# Patient Record
Sex: Male | Born: 1980 | Race: White | Hispanic: No | Marital: Single | Smoking: Current every day smoker
Health system: Southern US, Community
[De-identification: ages and names within clinical notes are randomized; demographics above are authoritative.]

## PROBLEM LIST (undated history)

## (undated) DIAGNOSIS — F32A Depression, unspecified: Secondary | ICD-10-CM

## (undated) DIAGNOSIS — T7840XA Allergy, unspecified, initial encounter: Secondary | ICD-10-CM

## (undated) DIAGNOSIS — F419 Anxiety disorder, unspecified: Secondary | ICD-10-CM

## (undated) DIAGNOSIS — G8929 Other chronic pain: Secondary | ICD-10-CM

## (undated) DIAGNOSIS — F329 Major depressive disorder, single episode, unspecified: Secondary | ICD-10-CM

## (undated) DIAGNOSIS — M549 Dorsalgia, unspecified: Secondary | ICD-10-CM

## (undated) HISTORY — DX: Allergy, unspecified, initial encounter: T78.40XA

## (undated) HISTORY — PX: NO PAST SURGERIES: SHX2092

---

## 2001-06-05 ENCOUNTER — Emergency Department (HOSPITAL_COMMUNITY): Admission: EM | Admit: 2001-06-05 | Discharge: 2001-06-05 | Payer: Self-pay | Admitting: Emergency Medicine

## 2001-06-05 ENCOUNTER — Encounter: Payer: Self-pay | Admitting: Emergency Medicine

## 2006-01-25 ENCOUNTER — Emergency Department (HOSPITAL_COMMUNITY): Admission: EM | Admit: 2006-01-25 | Discharge: 2006-01-25 | Payer: Self-pay | Admitting: Emergency Medicine

## 2006-03-01 ENCOUNTER — Ambulatory Visit: Payer: Self-pay | Admitting: Family Medicine

## 2007-01-04 ENCOUNTER — Emergency Department (HOSPITAL_COMMUNITY): Admission: EM | Admit: 2007-01-04 | Discharge: 2007-01-04 | Payer: Self-pay | Admitting: Emergency Medicine

## 2007-01-26 ENCOUNTER — Encounter: Admission: RE | Admit: 2007-01-26 | Discharge: 2007-01-26 | Payer: Self-pay | Admitting: Orthopaedic Surgery

## 2007-02-14 ENCOUNTER — Encounter: Admission: RE | Admit: 2007-02-14 | Discharge: 2007-02-14 | Payer: Self-pay | Admitting: Orthopaedic Surgery

## 2008-08-27 ENCOUNTER — Emergency Department (HOSPITAL_COMMUNITY): Admission: EM | Admit: 2008-08-27 | Discharge: 2008-08-27 | Payer: Self-pay | Admitting: Emergency Medicine

## 2009-01-22 ENCOUNTER — Emergency Department (HOSPITAL_COMMUNITY): Admission: EM | Admit: 2009-01-22 | Discharge: 2009-01-22 | Payer: Self-pay | Admitting: Emergency Medicine

## 2010-04-14 ENCOUNTER — Emergency Department (HOSPITAL_BASED_OUTPATIENT_CLINIC_OR_DEPARTMENT_OTHER): Admission: EM | Admit: 2010-04-14 | Discharge: 2010-04-14 | Payer: Self-pay | Admitting: Emergency Medicine

## 2011-06-11 ENCOUNTER — Emergency Department (HOSPITAL_COMMUNITY): Payer: PRIVATE HEALTH INSURANCE

## 2011-06-11 ENCOUNTER — Emergency Department (HOSPITAL_COMMUNITY)
Admission: EM | Admit: 2011-06-11 | Discharge: 2011-06-11 | Disposition: A | Discharge status: Home / Self Care | Payer: PRIVATE HEALTH INSURANCE | Attending: Emergency Medicine | Admitting: Emergency Medicine

## 2011-06-11 DIAGNOSIS — R5381 Other malaise: Secondary | ICD-10-CM | POA: Insufficient documentation

## 2011-06-11 DIAGNOSIS — R059 Cough, unspecified: Secondary | ICD-10-CM | POA: Insufficient documentation

## 2011-06-11 DIAGNOSIS — J189 Pneumonia, unspecified organism: Secondary | ICD-10-CM | POA: Insufficient documentation

## 2011-06-11 DIAGNOSIS — R0789 Other chest pain: Secondary | ICD-10-CM | POA: Insufficient documentation

## 2011-06-11 DIAGNOSIS — R0602 Shortness of breath: Secondary | ICD-10-CM | POA: Insufficient documentation

## 2011-06-11 DIAGNOSIS — R05 Cough: Secondary | ICD-10-CM | POA: Insufficient documentation

## 2011-12-09 ENCOUNTER — Emergency Department (HOSPITAL_COMMUNITY): Payer: No Typology Code available for payment source

## 2011-12-09 ENCOUNTER — Emergency Department (HOSPITAL_COMMUNITY)
Admission: EM | Admit: 2011-12-09 | Discharge: 2011-12-09 | Disposition: A | Discharge status: Home / Self Care | Payer: No Typology Code available for payment source | Attending: Emergency Medicine | Admitting: Emergency Medicine

## 2011-12-09 ENCOUNTER — Encounter: Payer: Self-pay | Admitting: *Deleted

## 2011-12-09 DIAGNOSIS — M545 Low back pain, unspecified: Secondary | ICD-10-CM | POA: Insufficient documentation

## 2011-12-09 DIAGNOSIS — M542 Cervicalgia: Secondary | ICD-10-CM | POA: Insufficient documentation

## 2011-12-09 DIAGNOSIS — G8929 Other chronic pain: Secondary | ICD-10-CM | POA: Insufficient documentation

## 2011-12-09 DIAGNOSIS — F17200 Nicotine dependence, unspecified, uncomplicated: Secondary | ICD-10-CM | POA: Insufficient documentation

## 2011-12-09 DIAGNOSIS — S335XXA Sprain of ligaments of lumbar spine, initial encounter: Secondary | ICD-10-CM | POA: Insufficient documentation

## 2011-12-09 DIAGNOSIS — S139XXA Sprain of joints and ligaments of unspecified parts of neck, initial encounter: Secondary | ICD-10-CM | POA: Insufficient documentation

## 2011-12-09 DIAGNOSIS — R51 Headache: Secondary | ICD-10-CM | POA: Insufficient documentation

## 2011-12-09 DIAGNOSIS — S39012A Strain of muscle, fascia and tendon of lower back, initial encounter: Secondary | ICD-10-CM

## 2011-12-09 DIAGNOSIS — S161XXA Strain of muscle, fascia and tendon at neck level, initial encounter: Secondary | ICD-10-CM

## 2011-12-09 HISTORY — DX: Other chronic pain: G89.29

## 2011-12-09 HISTORY — DX: Dorsalgia, unspecified: M54.9

## 2011-12-09 MED ORDER — HYDROCODONE-ACETAMINOPHEN 5-325 MG PO TABS: 1.0000 | ORAL_TABLET | Freq: Four times a day (QID) | ORAL | Status: AC | PRN

## 2011-12-09 MED ORDER — NAPROXEN 500 MG PO TABS: 500.0000 mg | ORAL_TABLET | Freq: Two times a day (BID) | ORAL | Status: AC
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
## 2011-12-09 NOTE — ED Notes (Signed)
Discharge instructions reviewed with pt, questions answered. Pt verbalized understanding.  

## 2011-12-09 NOTE — ED Notes (Signed)
MD at bedside. 
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
## 2012-09-29 ENCOUNTER — Ambulatory Visit (INDEPENDENT_AMBULATORY_CARE_PROVIDER_SITE_OTHER): Payer: PRIVATE HEALTH INSURANCE | Admitting: Family Medicine

## 2012-09-29 ENCOUNTER — Encounter: Payer: Self-pay | Admitting: Family Medicine

## 2012-09-29 VITALS — BP 124/80 | HR 77 | Temp 97.7°F | Resp 16 | Ht 71.5 in | Wt 181.0 lb

## 2012-09-29 DIAGNOSIS — F438 Other reactions to severe stress: Secondary | ICD-10-CM

## 2012-09-29 DIAGNOSIS — F4329 Adjustment disorder with other symptoms: Secondary | ICD-10-CM

## 2012-09-29 MED ORDER — SERTRALINE HCL 50 MG PO TABS: 50.0000 mg | ORAL_TABLET | Freq: Every day | ORAL | Status: DC

## 2012-09-29 MED ORDER — CLONAZEPAM 0.5 MG PO TABS: 0.5000 mg | ORAL_TABLET | Freq: Two times a day (BID) | ORAL | Status: DC | PRN

## 2012-09-29 NOTE — Progress Notes (Signed)
31 yo with h/o stress problems.  He split with partner and now lives alone.   2 children:  3 and 1 yo, he sees them every other weekend  He's working in [place], lost temper a couple times.  He pulls orders for mattress materials.  O:  NAD Alert, cooperative, articulate We discussed his chronic irritability, although he is quite pleasant today.  A:  Patient has shown no drug dependency behavior and seems genuinely interested in the well-being of his two girls  P: 1. Stress and adjustment reaction  clonazePAM (KLONOPIN) 0.5 MG tablet, sertraline (ZOLOFT) 50 MG tablet

## 2013-06-23 ENCOUNTER — Other Ambulatory Visit: Payer: Self-pay | Admitting: Family Medicine

## 2013-06-24 ENCOUNTER — Other Ambulatory Visit: Payer: Self-pay | Admitting: Family Medicine

## 2015-09-01 ENCOUNTER — Emergency Department (HOSPITAL_COMMUNITY)
Admission: EM | Admit: 2015-09-01 | Discharge: 2015-09-02 | Disposition: A | Discharge status: Home / Self Care | Payer: Self-pay | Attending: Emergency Medicine | Admitting: Emergency Medicine

## 2015-09-01 ENCOUNTER — Encounter (HOSPITAL_COMMUNITY): Payer: Self-pay | Admitting: *Deleted

## 2015-09-01 DIAGNOSIS — Y998 Other external cause status: Secondary | ICD-10-CM | POA: Insufficient documentation

## 2015-09-01 DIAGNOSIS — G8929 Other chronic pain: Secondary | ICD-10-CM | POA: Insufficient documentation

## 2015-09-01 DIAGNOSIS — Y9389 Activity, other specified: Secondary | ICD-10-CM | POA: Insufficient documentation

## 2015-09-01 DIAGNOSIS — S82831A Other fracture of upper and lower end of right fibula, initial encounter for closed fracture: Secondary | ICD-10-CM | POA: Insufficient documentation

## 2015-09-01 DIAGNOSIS — Y92008 Other place in unspecified non-institutional (private) residence as the place of occurrence of the external cause: Secondary | ICD-10-CM | POA: Insufficient documentation

## 2015-09-01 DIAGNOSIS — W541XXA Struck by dog, initial encounter: Secondary | ICD-10-CM | POA: Insufficient documentation

## 2015-09-01 DIAGNOSIS — Z720 Tobacco use: Secondary | ICD-10-CM | POA: Insufficient documentation

## 2015-09-01 NOTE — ED Notes (Signed)
Pt c/o right knee and leg pain.

## 2015-09-02 ENCOUNTER — Emergency Department (HOSPITAL_COMMUNITY): Payer: Self-pay

## 2015-09-02 MED ORDER — OXYCODONE-ACETAMINOPHEN 5-325 MG PO TABS: 1.0000 | ORAL_TABLET | ORAL | Status: DC | PRN

## 2015-09-02 MED ORDER — IBUPROFEN 800 MG PO TABS
800.0000 mg | ORAL_TABLET | Freq: Once | ORAL | Status: AC
  Administered 2015-09-02: 800 mg via ORAL
  Filled 2015-09-02: qty 1

## 2015-09-02 NOTE — Discharge Instructions (Signed)
Use crutches. Wear the knee immobilizer.  Knee Fracture, Adult A knee fracture is a break in any of the bones of the lower part of the thigh bone, the upper part of the bones of the lower leg, or of the kneecap. When the bones no longer meet the way they are supposed to it is called a dislocation. Sometimes there can be a dislocation along with fractures. SYMPTOMS  Symptoms may include pain, swelling, inability to bend the knee, deformity of the knee, and inability to walk.  DIAGNOSIS  This problem is usually diagnosed with x-rays. Special studies are sometimes done if a fracture is suspected but cannot be seen on ordinary x-rays. If vessels around the knee are injured, special tests may be done to see what the damage is. TREATMENT   The leg is usually splinted for the first couple of days to allow for swelling. After the swelling is down a cast is put on. Sometimes a cast is put on right away with the sides of the cast cut to allow the knee to swell. If the bones are in place, this may be all that is needed.  If the bones are out of place, medications for pain are given to allow them to be put back in place. If they are seriously out of place, surgery may be needed to hold the pieces or breaks in place using wires, pins, screws or metal plates.  Generally most fractures will heal in 4 to 6 weeks. HOME CARE INSTRUCTIONS   Use your crutches as directed.  To lessen the swelling, keep the injured leg elevated while sitting or lying down.  Apply ice to the injury for 15-20 minutes, 03-04 times per day while awake for 2 days. Put the ice in a plastic bag and place a thin towel between the bag of ice and your cast.  If you have a plaster or fiberglass cast:  Do not try to scratch the skin under the cast using sharp or pointed objects.  Check the skin around the cast every day. You may put lotion on any red or sore areas.  Keep your cast dry and clean.  If you have a plaster splint:  Wear  the splint as directed.  You may loosen the elastic around the splint if your toes become numb, tingle, or turn cold or blue.  Do not put pressure on any part of your cast or splint; it may break. Rest your cast only on a pillow the first 24 hours until it is fully hardened.  Your cast or splint can be protected during bathing with a plastic bag. Do not lower the cast or splint into water.  Only take over-the-counter or prescription medicines for pain, discomfort, or fever as directed by your caregiver.  See your caregiver soon if your cast gets damaged or breaks.  It is very important to keep all follow up appointments. Not following up as directed may result in a worsening of your condition or a failure of the fracture to heal properly. SEEK IMMEDIATE MEDICAL CARE IF:  You have continued severe pain.  You have more swelling than you did before the cast was put on.  The area below the fracture becomes painful.  Your skin or toenails below the injury turn blue or gray, or feel cold or numb.  There is drainage coming from under the cast.  New, unexplained symptoms develop (drugs used in treatment may produce side effects). MAKE SURE YOU:   Understand these instructions.  Will watch your condition.  Will get help right away if you are not doing well or get worse. Document Released: 10/06/2006 Document Revised: 02/15/2012 Document Reviewed: 11/07/2007 [facility] Patient Information 2015 [place], [place]. This information is not intended to replace advice given to you by your health care provider. Make sure you discuss any questions you have with your health care provider.  Acetaminophen; Oxycodone tablets What is this medicine? ACETAMINOPHEN; OXYCODONE (a set a MEE noe fen; ox i KOE done) is a pain reliever. It is used to treat mild to moderate pain. This medicine may be used for other purposes; ask your health care provider or pharmacist if you have questions. COMMON BRAND NAME(S):  Endocet, Magnacet, Narvox, Percocet, Perloxx, Primalev, Primlev, Roxicet, Xolox What should I tell my health care provider before I take this medicine? They need to know if you have any of these conditions: -brain tumor -Crohn's disease, inflammatory bowel disease, or ulcerative colitis -drug abuse or addiction -head injury -heart or circulation problems -if you often drink alcohol -kidney disease or problems going to the bathroom -liver disease -lung disease, asthma, or breathing problems -an unusual or allergic reaction to acetaminophen, oxycodone, other opioid analgesics, other medicines, foods, dyes, or preservatives -pregnant or trying to get pregnant -breast-feeding How should I use this medicine? Take this medicine by mouth with a full glass of water. Follow the directions on the prescription label. Take your medicine at regular intervals. Do not take your medicine more often than directed. Talk to your pediatrician regarding the use of this medicine in children. Special care may be needed. Patients over 96 years old may have a stronger reaction and need a smaller dose. Overdosage: If you think you have taken too much of this medicine contact a poison control center or emergency room at once. NOTE: This medicine is only for you. Do not share this medicine with others. What if I miss a dose? If you miss a dose, take it as soon as you can. If it is almost time for your next dose, take only that dose. Do not take double or extra doses. What may interact with this medicine? -alcohol -antihistamines -barbiturates like amobarbital, butalbital, butabarbital, methohexital, pentobarbital, phenobarbital, thiopental, and secobarbital -benztropine -drugs for bladder problems like solifenacin, trospium, oxybutynin, tolterodine, hyoscyamine, and methscopolamine -drugs for breathing problems like ipratropium and tiotropium -drugs for certain stomach or intestine problems like propantheline,  homatropine methylbromide, glycopyrrolate, atropine, belladonna, and dicyclomine -general anesthetics like etomidate, ketamine, nitrous oxide, propofol, desflurane, enflurane, halothane, isoflurane, and sevoflurane -medicines for depression, anxiety, or psychotic disturbances -medicines for sleep -muscle relaxants -naltrexone -narcotic medicines (opiates) for pain -phenothiazines like perphenazine, thioridazine, chlorpromazine, mesoridazine, fluphenazine, prochlorperazine, promazine, and trifluoperazine -scopolamine -tramadol -trihexyphenidyl This list may not describe all possible interactions. Give your health care provider a list of all the medicines, herbs, non-prescription drugs, or dietary supplements you use. Also tell them if you smoke, drink alcohol, or use illegal drugs. Some items may interact with your medicine. What should I watch for while using this medicine? Tell your doctor or health care professional if your pain does not go away, if it gets worse, or if you have new or a different type of pain. You may develop tolerance to the medicine. Tolerance means that you will need a higher dose of the medication for pain relief. Tolerance is normal and is expected if you take this medicine for a long time. Do not suddenly stop taking your medicine because you may develop a severe reaction.  Your body becomes used to the medicine. This does NOT mean you are addicted. Addiction is a behavior related to getting and using a drug for a non-medical reason. If you have pain, you have a medical reason to take pain medicine. Your doctor will tell you how much medicine to take. If your doctor wants you to stop the medicine, the dose will be slowly lowered over time to avoid any side effects. You may get drowsy or dizzy. Do not drive, use machinery, or do anything that needs mental alertness until you know how this medicine affects you. Do not stand or sit up quickly, especially if you are an older  patient. This reduces the risk of dizzy or fainting spells. Alcohol may interfere with the effect of this medicine. Avoid alcoholic drinks. There are different types of narcotic medicines (opiates) for pain. If you take more than one type at the same time, you may have more side effects. Give your health care provider a list of all medicines you use. Your doctor will tell you how much medicine to take. Do not take more medicine than directed. Call emergency for help if you have problems breathing. The medicine will cause constipation. Try to have a bowel movement at least every 2 to 3 days. If you do not have a bowel movement for 3 days, call your doctor or health care professional. Do not take Tylenol (acetaminophen) or medicines that have acetaminophen with this medicine. Too much acetaminophen can be very dangerous. Many nonprescription medicines contain acetaminophen. Always read the labels carefully to avoid taking more acetaminophen. What side effects may I notice from receiving this medicine? Side effects that you should report to your doctor or health care professional as soon as possible: -allergic reactions like skin rash, itching or hives, swelling of the face, lips, or tongue -breathing difficulties, wheezing -confusion -light headedness or fainting spells -severe stomach pain -unusually weak or tired -yellowing of the skin or the whites of the eyes Side effects that usually do not require medical attention (report to your doctor or health care professional if they continue or are bothersome): -dizziness -drowsiness -nausea -vomiting This list may not describe all possible side effects. Call your doctor for medical advice about side effects. You may report side effects to FDA at 1-800-FDA-1088. Where should I keep my medicine? Keep out of the reach of children. This medicine can be abused. Keep your medicine in a safe place to protect it from theft. Do not share this medicine with  anyone. Selling or giving away this medicine is dangerous and against the law. Store at room temperature between 20 and 25 degrees C (68 and 77 degrees F). Keep container tightly closed. Protect from light. This medicine may cause accidental overdose and death if it is taken by other adults, children, or pets. Flush any unused medicine down the toilet to reduce the chance of harm. Do not use the medicine after the expiration date. NOTE: This sheet is a summary. It may not cover all possible information. If you have questions about this medicine, talk to your doctor, pharmacist, or health care provider.  2015, [employer]vier/[employer] Standard. (2013-07-17 13:17:35)

## 2015-09-02 NOTE — ED Provider Notes (Signed)
CSN: [id number]     Arrival date & time 09/01/15  2339 History  This chart was scribed for Dione Booze, MD by Ronney Lion, ED Scribe. This patient was seen in room APA17/APA17 and the patient's care was started at 12:19 AM.    Chief Complaint  Patient presents with  . Leg Pain   The history is provided by the patient. No language interpreter was used.   HPI Comments: Harold Mccarthy is a 34 y.o. male who presents to the Emergency Department complaining of sudden-onset, constant 8/10 right knee and leg pain that began about 5 hours ago, after patient was jerked by a pet that ran off a porch. Harold Mccarthy denies any other injuries. Harold Mccarthy denies a history of any issues with his right knee. Harold Mccarthy also denies having an orthopedist.   Past Medical History  Diagnosis Date  . Chronic back pain    History reviewed. No pertinent past surgical history. History reviewed. No pertinent family history. Social History  Substance Use Topics  . Smoking status: Current Every Day Smoker -- 1.50 packs/day  . Smokeless tobacco: None  . Alcohol Use: Yes     Comment: occasional    Review of Systems  Musculoskeletal: Positive for arthralgias (right knee pain).  All other systems reviewed and are negative.   Allergies  Review of patient's allergies indicates no known allergies.  Home Medications   Prior to Admission medications   Not on File   BP 135/81 mmHg  Pulse 101  Temp(Src) 99.1 F (37.3 C) (Oral)  Resp 20  Ht 6' (1.829 m)  Wt 190 lb (86.183 kg)  BMI 25.76 kg/m2  SpO2 100% Physical Exam  Constitutional: Harold Mccarthy is oriented to person, place, and time. Harold Mccarthy appears well-developed and well-nourished. No distress.  HENT:  Head: Normocephalic and atraumatic.  Eyes: Conjunctivae and EOM are normal. Pupils are equal, round, and reactive to light.  Neck: Normal range of motion. Neck supple. No JVD present.  Cardiovascular: Normal rate, regular rhythm and normal heart sounds.   No murmur heard. Pulmonary/Chest:  Effort normal and breath sounds normal. Harold Mccarthy has no wheezes. Harold Mccarthy has no rales. Harold Mccarthy exhibits no tenderness.  Abdominal: Soft. Bowel sounds are normal. Harold Mccarthy exhibits no distension and no mass. There is no tenderness.  Musculoskeletal: Normal range of motion. Harold Mccarthy exhibits tenderness.  Mild swelling of the right knee. Tender medially and laterally. No effusion. Pain with any passive ROM. Negative Lachman sign. No instability on valgus or varus stress. Unable to do McMurray test because of pain.  Lymphadenopathy:    Harold Mccarthy has no cervical adenopathy.  Neurological: Harold Mccarthy is alert and oriented to person, place, and time. No cranial nerve deficit. Harold Mccarthy exhibits normal muscle tone. Coordination normal.  Skin: Skin is warm and dry. No rash noted.  Psychiatric: Harold Mccarthy has a normal mood and affect. His behavior is normal. Judgment and thought content normal.  Nursing note and vitals reviewed.   ED Course  Procedures (including critical care time)  DIAGNOSTIC STUDIES: Oxygen Saturation is 100% on RA, normal by my interpretation.    COORDINATION OF CARE: 12:22 AM - Discussed treatment plan with pt at bedside which includes ibuprofen and XR. Advised ice and elevation. Discussed with pt that ligament and cartilage injuries do not show up on the XR. Pt verbalized understanding and agreed to plan.   Imaging Review Dg Knee Complete 4 Views Right  09/02/2015   CLINICAL DATA:  Right knee pain for several hours after injury walking dog. Initial  encounter.  EXAM: RIGHT KNEE - COMPLETE 4+ VIEW  COMPARISON:  None.  FINDINGS: There is an oblique nondisplaced fracture through the fibular head and neck.  No dislocation or malalignment.  Small knee joint effusion.  IMPRESSION: 1. Nondisplaced fibular head and neck fracture. 2. Small joint effusion.   Electronically Signed   By: Marnee Spring M.D.   On: 09/02/2015 01:08   I have personally reviewed and evaluated these images as part of my medical decision-making.   MDM   Final  diagnoses:  Traumatic closed nondisplaced fracture of proximal end of right fibula, initial encounter    Fall with injury to right knee. No evidence of serious knee injury on exam. Harold Mccarthy will be sent for x-rays. Anticipate placing him in a knee immobilizer and referred to orthopedics.   X-rays actually show nondisplaced fracture of the proximal fibula. Harold Mccarthy is placed in a knee immobilizer and given prescription for oxycodone have acetaminophen and referred to orthopedics.  I personally performed the services described in this documentation, which was scribed in my presence. The recorded information has been reviewed and is accurate.      Dione Booze, MD 09/02/15 0130

## 2015-09-03 ENCOUNTER — Telehealth: Payer: Self-pay

## 2015-09-03 MED FILL — Oxycodone w/ Acetaminophen Tab 5-325 MG: ORAL | Qty: 6 | Status: AC

## 2015-09-03 NOTE — Telephone Encounter (Signed)
Referral request for broke his leg and requesting MRI.  Had x-ray at ER.   [phone]

## 2015-09-04 ENCOUNTER — Ambulatory Visit (INDEPENDENT_AMBULATORY_CARE_PROVIDER_SITE_OTHER): Payer: 59 | Admitting: Emergency Medicine

## 2015-09-04 VITALS — BP 128/82 | HR 84 | Temp 98.2°F | Resp 18 | Ht 72.0 in | Wt 190.0 lb

## 2015-09-04 DIAGNOSIS — S82831A Other fracture of upper and lower end of right fibula, initial encounter for closed fracture: Secondary | ICD-10-CM

## 2015-09-04 NOTE — Telephone Encounter (Signed)
Pt called back, spoke with him and advised him to come in to the [facility]. Pt understood and stated he was on his way.

## 2015-09-04 NOTE — Progress Notes (Signed)
   Subjective:    Patient ID: Harold Mccarthy, male    DOB: 1981-10-02, 33 y.o.   MRN: [id number]  HPI    Review of Systems     Objective:   Physical Exam        Assessment & Plan:

## 2015-09-04 NOTE — Progress Notes (Signed)
Chief Complaint:  Chief Complaint  Patient presents with  . Hospitalization Follow-up    broken leg-needing mri   . Leg Injury    rt     HPI: Harold Mccarthy is a 34 y.o. male who is here for a right leg injury. Patient states he fell off of a porch on Saturday afternoon. He went to the ER on Sunday. ER doctor told him he wanted to do a MRI of the leg but was unable to get one. Patient had an appointment with Kriste Basque at [facility] earlier this week. He states he is worried about returning to work due to the fracture. He works in [employer].   Patient is worried about his depression and/or   Past Medical History  Diagnosis Date  . Chronic back pain   . Allergy    History reviewed. No pertinent past surgical history. Social History   Social History  . Marital Status: Single    Spouse Name: N/A  . Number of Children: N/A  . Years of Education: N/A   Social History Main Topics  . Smoking status: Current Every Day Smoker -- 1.50 packs/day  . Smokeless tobacco: None  . Alcohol Use: 0.0 oz/week    0 Standard drinks or equivalent per week     Comment: occasional  . Drug Use: No  . Sexual Activity: Not Asked   Other Topics Concern  . None   Social History Narrative   History reviewed. No pertinent family history. No Known Allergies Prior to Admission medications   Medication Sig Start Date End Date Taking? Authorizing Provider  oxyCODONE-acetaminophen (PERCOCET) 5-325 MG per tablet Take 1 tablet by mouth every 4 (four) hours as needed for moderate pain. 09/02/15  Yes Dione Booze, MD  oxyCODONE-acetaminophen (PERCOCET/ROXICET) 5-325 MG per tablet Take 1 tablet by mouth every 4 (four) hours as needed. 09/02/15   Dione Booze, MD     ROS: The patient denies fevers, chills, night sweats, unintentional weight loss, chest pain, palpitations, wheezing, dyspnea on exertion, nausea, vomiting, abdominal pain, dysuria, hematuria, melena, numbness, weakness, or  tingling.  All other systems have been reviewed and were otherwise negative with the exception of those mentioned in the HPI and as above.    PHYSICAL EXAM: Filed Vitals:   09/04/15 1431  BP: 128/82  Pulse: 84  Temp: 98.2 F (36.8 C)  Resp: 18   Filed Vitals:   09/04/15 1431  Height: 6' (1.829 m)  Weight: 190 lb (86.183 kg)   Body mass index is 25.76 kg/(m^2).   General: Alert, no acute distress HEENT:  Normocephalic, atraumatic, oropharynx patent. EOMI, PERRLA Cardiovascular:  Regular rate and rhythm, no rubs murmurs or gallops.  No Carotid bruits, radial pulse intact. No pedal edema.  Respiratory: Clear to auscultation bilaterally.  No wheezes, rales, or rhonchi.  No cyanosis, no use of accessory musculature GI: No organomegaly, abdomen is soft and non-tender, positive bowel sounds.  No masses. Skin: No rashes. Neurologic: Facial musculature symmetric. Psychiatric: Patient is appropriate throughout our interaction. Lymphatic: No cervical lymphadenopathy Musculoskeletal: Gait intact. There is a joint effusion on the right. There is significant tenderness over the head of the fibula. There is no definite Lachman's sign.   LABS: No results found for this or any previous visit.   EKG/XRAY:   Primary read interpreted by Dr. Cleta Alberts at [facility].   ASSESSMENT/PLAN: Patient seen with a fracture of the fibula. He has significant fluid on the joint. He went  to the orthopedist at [facility] orthopedics he saw the PA Kriste Basque during that visit. Patient here today because he states he needs a referral to [place] orth[facility] so they can order his MRI. Patient also had questions regarding refills on his nerve medications. He has discussed this with Dr. Dallie Dad in the past. They gave him Dr. Arnoldo Lenis scheduled to discuss with him. We did talk with the orthopedic office and they will be scheduling his MRI and contacting him.I personally performed the services described in this  documentation, which was scribed in my presence. The recorded information has been reviewed and is accurate.   Gross sideeffects, risk and benefits, and alternatives of medications d/w patient. Patient is aware that all medications have potential sideeffects and we are unable to predict every sideeffect or drug-drug interaction that may occur.  @ 09/04/2015 2:38 PM

## 2015-09-04 NOTE — Telephone Encounter (Signed)
Left message on pt's vm for him to call back. Pt needs an OV for hospital follow up.

## 2015-09-11 ENCOUNTER — Ambulatory Visit (INDEPENDENT_AMBULATORY_CARE_PROVIDER_SITE_OTHER): Payer: 59 | Admitting: Family Medicine

## 2015-09-11 ENCOUNTER — Other Ambulatory Visit: Payer: Self-pay | Admitting: Physician Assistant

## 2015-09-11 VITALS — BP 112/80 | HR 74 | Temp 98.6°F | Resp 16 | Ht 72.0 in | Wt 190.6 lb

## 2015-09-11 DIAGNOSIS — M25561 Pain in right knee: Secondary | ICD-10-CM

## 2015-09-11 DIAGNOSIS — F4323 Adjustment disorder with mixed anxiety and depressed mood: Secondary | ICD-10-CM | POA: Diagnosis not present

## 2015-09-11 MED ORDER — SERTRALINE HCL 50 MG PO TABS: 50.0000 mg | ORAL_TABLET | Freq: Every day | ORAL | Status: DC

## 2015-09-11 MED ORDER — CLONAZEPAM 0.5 MG PO TABS: 0.5000 mg | ORAL_TABLET | Freq: Two times a day (BID) | ORAL | Status: DC | PRN

## 2015-09-11 NOTE — Progress Notes (Signed)
Subjective:  This chart was scribed for  Elvina Sidle MD, by Veverly Fells, at [facility].  This patient was seen in room 1 and the patient's care was started at 1:05 PM.   Chief Complaint  Patient presents with   Follow-up    Rt Knee      Patient ID: Harold Mccarthy, male    DOB: 1981-04-06, 34 y.o.   MRN: [id number]  HPI  HPI Comments: Harold Mccarthy is a 34 y.o. male who presents to the [facility] for a follow up regarding his right knee.  Patient broke his fibula more than 2 weeks ago while he was holding his dog (who was chasing a cat) on a leash and jumped off his porch.  Patient set up his MRI this morning but then got a call stating that his insurance required him to pay out of pocket.  He has now cancelled his MRI because he is not able to afford it.  He has not been wearing a brace around the house and has been walking around. 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 He is able to have light duty at work. He has been out of work for over a week.  Patient is a smoker and states that he is smoking "twice as much" as he used to.   Depression: Patient notes that he is having his ups and downs. When he does feel down, he feels very depressed with life and feels like he has let his family down as well as himself.  He denies any thoughts of suicide.  He has difficulty going to sleep and states that he only gets about 5 hours of sleep every day.  Patient states his kids are doing well and his oldest is in first grade.  He is able to spend time with them every other weekend.       There are no active problems to display for this patient.  Past Medical History  Diagnosis Date   Chronic back pain    Allergy    No past surgical history on file. No Known Allergies Prior to Admission medications   Medication Sig Start Date End Date Taking? Authorizing Provider  oxyCODONE-acetaminophen (PERCOCET) 5-325 MG per tablet Take 1  tablet by mouth every 4 (four) hours as needed for moderate pain. 09/02/15   Dione Booze, MD  oxyCODONE-acetaminophen (PERCOCET/ROXICET) 5-325 MG per tablet Take 1 tablet by mouth every 4 (four) hours as needed. 09/02/15   Dione Booze, MD   Social History   Social History   Marital Status: Single    Spouse Name: N/A   Number of Children: N/A   Years of Education: N/A   Occupational History   Not on file.   Social History Main Topics   Smoking status: Current Every Day Smoker -- 1.50 packs/day   Smokeless tobacco: Not on file   Alcohol Use: 0.0 oz/week    0 Standard drinks or equivalent per week     Comment: occasional   Drug Use: No   Sexual Activity: Not on file   Other Topics Concern   Not on file   Social History Narrative     Review of Systems  Constitutional: Negative for fever and chills.  Eyes: Negative for pain, redness and itching.  Respiratory: Negative for cough and shortness of breath.   Gastrointestinal: Negative for nausea and vomiting.  Psychiatric/Behavioral: Positive for sleep disturbance. Negative for suicidal ideas and self-injury.  Objective:   Physical Exam  Constitutional: He is oriented to person, place, and time. He appears well-developed and well-nourished. No distress.  HENT:  Head: Normocephalic and atraumatic.  Eyes: Pupils are equal, round, and reactive to light.  Cardiovascular: Normal rate.   Pulmonary/Chest: Effort normal. No respiratory distress.  Neurological: He is alert and oriented to person, place, and time.  Skin: Skin is warm and dry.  Nursing note and vitals reviewed.  Right knee: Full range of motion, no effusion, ecchymosis diffusely in the popliteal area  Patient interviewed extensively about his anxiety and depression. He is not suicidal but has mood swings and is easily distractible. He did well on Zoloft and clonazepam in the past. Filed Vitals:   09/11/15 1300  BP: 112/80  Pulse: 74  Temp: 98.6 F  (37 C)  TempSrc: Oral  Resp: 16  Height: 6' (1.829 m)  Weight: 190 lb 9.6 oz (86.456 kg)  SpO2: 99%       Assessment & Plan:   This chart was scribed in my presence and reviewed by me personally.    ICD-9-CM ICD-10-CM   1. Adjustment disorder with mixed anxiety and depressed mood 309.28 F43.23 sertraline (ZOLOFT) 50 MG tablet     clonazePAM (KLONOPIN) 0.5 MG tablet   Patient given note for his fibular fracture so that he can return to light duty for the next 10 days and then resume full duty.  Signed, Elvina Sidle, MD

## 2015-09-23 ENCOUNTER — Inpatient Hospital Stay: Admission: RE | Admit: 2015-09-23 | Payer: Self-pay | Source: Ambulatory Visit

## 2016-11-05 ENCOUNTER — Encounter (HOSPITAL_COMMUNITY): Payer: Self-pay | Admitting: Emergency Medicine

## 2016-11-05 ENCOUNTER — Emergency Department (HOSPITAL_COMMUNITY): Payer: BLUE CROSS/BLUE SHIELD

## 2016-11-05 ENCOUNTER — Emergency Department (HOSPITAL_COMMUNITY)
Admission: EM | Admit: 2016-11-05 | Discharge: 2016-11-05 | Disposition: A | Discharge status: Home / Self Care | Payer: BLUE CROSS/BLUE SHIELD | Attending: Emergency Medicine | Admitting: Emergency Medicine

## 2016-11-05 DIAGNOSIS — Y939 Activity, unspecified: Secondary | ICD-10-CM | POA: Diagnosis not present

## 2016-11-05 DIAGNOSIS — S6992XA Unspecified injury of left wrist, hand and finger(s), initial encounter: Secondary | ICD-10-CM | POA: Diagnosis present

## 2016-11-05 DIAGNOSIS — S62325A Displaced fracture of shaft of fourth metacarpal bone, left hand, initial encounter for closed fracture: Secondary | ICD-10-CM | POA: Insufficient documentation

## 2016-11-05 DIAGNOSIS — Z23 Encounter for immunization: Secondary | ICD-10-CM | POA: Insufficient documentation

## 2016-11-05 DIAGNOSIS — Y999 Unspecified external cause status: Secondary | ICD-10-CM | POA: Insufficient documentation

## 2016-11-05 DIAGNOSIS — S80212A Abrasion, left knee, initial encounter: Secondary | ICD-10-CM | POA: Diagnosis not present

## 2016-11-05 DIAGNOSIS — S62316A Displaced fracture of base of fifth metacarpal bone, right hand, initial encounter for closed fracture: Secondary | ICD-10-CM | POA: Diagnosis not present

## 2016-11-05 DIAGNOSIS — S62309A Unspecified fracture of unspecified metacarpal bone, initial encounter for closed fracture: Secondary | ICD-10-CM

## 2016-11-05 DIAGNOSIS — Y92410 Unspecified street and highway as the place of occurrence of the external cause: Secondary | ICD-10-CM | POA: Insufficient documentation

## 2016-11-05 DIAGNOSIS — F17200 Nicotine dependence, unspecified, uncomplicated: Secondary | ICD-10-CM | POA: Insufficient documentation

## 2016-11-05 MED ORDER — HYDROMORPHONE HCL 2 MG/ML IJ SOLN
1.0000 mg | Freq: Once | INTRAMUSCULAR | Status: AC
  Administered 2016-11-05: 1 mg via INTRAVENOUS
  Filled 2016-11-05: qty 1

## 2016-11-05 MED ORDER — SODIUM CHLORIDE 0.9 % IV BOLUS (SEPSIS)
1000.0000 mL | Freq: Once | INTRAVENOUS | Status: AC
  Administered 2016-11-05: 1000 mL via INTRAVENOUS

## 2016-11-05 MED ORDER — LORAZEPAM 2 MG/ML IJ SOLN
1.0000 mg | Freq: Once | INTRAMUSCULAR | Status: AC
  Administered 2016-11-05: 1 mg via INTRAVENOUS
  Filled 2016-11-05: qty 1

## 2016-11-05 MED ORDER — HYDROMORPHONE HCL 2 MG/ML IJ SOLN
1.0000 mg | Freq: Once | INTRAMUSCULAR | Status: AC
Start: 2016-11-05 — End: 2016-11-05
  Administered 2016-11-05: 1 mg via INTRAVENOUS
  Filled 2016-11-05: qty 1

## 2016-11-05 MED ORDER — OXYCODONE-ACETAMINOPHEN 5-325 MG PO TABS: 1.0000 | ORAL_TABLET | Freq: Four times a day (QID) | ORAL | 0 refills | Status: DC | PRN

## 2016-11-05 MED ORDER — TETANUS-DIPHTH-ACELL PERTUSSIS 5-2.5-18.5 LF-MCG/0.5 IM SUSP
0.5000 mL | Freq: Once | INTRAMUSCULAR | Status: AC
  Administered 2016-11-05: 0.5 mL via INTRAMUSCULAR
  Filled 2016-11-05: qty 0.5

## 2016-11-05 NOTE — Progress Notes (Signed)
Orthopedic Tech Progress Note Patient Details:  Harold Mccarthy 1981/04/23 [id number]  Ortho Devices Type of Ortho Device: Ace wrap, Arm sling, Thumb spica splint, Ulna gutter splint Splint Material: Plaster Ortho Device/Splint Location: lue Ortho Device/Splint Interventions: Application   Harold Mccarthy 11/05/2016, 10:31 AM

## 2016-11-05 NOTE — Discharge Instructions (Signed)
It was our pleasure to provide your ER care today - we hope that you feel better.  It is extremely important that you keep your left hand, and right lower leg elevated as much as possible for the next 2-3 days - keep these areas elevated, above heart level - for example, lay in bed or on couch, with hand and leg elevated up on several pillows.   Keep splint clean/dry.   Apply cold/icepack to sore areas.   Take motrin or aleve as need for pain. You may also take percocet as need for pain. No driving for the next 6 hours or when taking percocet. Also, do not take tylenol or acetaminophen containing medication when taking percocet.   Follow up with orthopedic hand specialist tomorrow at 12:30 - see referral.   Return to ER right away if worse, new symptoms, severe or intractable pain, numbness/weakness, new pain, other concern.

## 2016-11-05 NOTE — ED Notes (Signed)
Ortho at bedside placing splint
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
## 2016-11-05 NOTE — ED Notes (Signed)
Called ortho tech to place ulnar gutter splint.
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
## 2016-11-05 NOTE — ED Notes (Signed)
Placed ice on pt's right lower leg and left hand.

## 2016-11-05 NOTE — ED Notes (Signed)
Ortho tech at bedside 

## 2016-11-06 ENCOUNTER — Ambulatory Visit: Payer: Self-pay | Admitting: Orthopedic Surgery

## 2016-11-07 ENCOUNTER — Ambulatory Visit (HOSPITAL_COMMUNITY): Payer: BLUE CROSS/BLUE SHIELD | Admitting: Anesthesiology

## 2016-11-07 ENCOUNTER — Encounter (HOSPITAL_COMMUNITY)
Admission: RE | Disposition: A | Discharge status: Home / Self Care | Payer: Self-pay | Source: Ambulatory Visit | Attending: Orthopedic Surgery

## 2016-11-07 ENCOUNTER — Ambulatory Visit (HOSPITAL_COMMUNITY)
Admission: RE | Admit: 2016-11-07 | Discharge: 2016-11-07 | Disposition: A | Discharge status: Home / Self Care | Payer: BLUE CROSS/BLUE SHIELD | Source: Ambulatory Visit | Attending: Orthopedic Surgery | Admitting: Orthopedic Surgery

## 2016-11-07 ENCOUNTER — Encounter (HOSPITAL_COMMUNITY): Payer: Self-pay | Admitting: *Deleted

## 2016-11-07 DIAGNOSIS — F17210 Nicotine dependence, cigarettes, uncomplicated: Secondary | ICD-10-CM | POA: Diagnosis not present

## 2016-11-07 DIAGNOSIS — S63642A Sprain of metacarpophalangeal joint of left thumb, initial encounter: Secondary | ICD-10-CM | POA: Diagnosis not present

## 2016-11-07 DIAGNOSIS — S62161A Displaced fracture of pisiform, right wrist, initial encounter for closed fracture: Secondary | ICD-10-CM | POA: Insufficient documentation

## 2016-11-07 DIAGNOSIS — S62325A Displaced fracture of shaft of fourth metacarpal bone, left hand, initial encounter for closed fracture: Secondary | ICD-10-CM | POA: Insufficient documentation

## 2016-11-07 DIAGNOSIS — S62202A Unspecified fracture of first metacarpal bone, left hand, initial encounter for closed fracture: Secondary | ICD-10-CM | POA: Insufficient documentation

## 2016-11-07 DIAGNOSIS — S62317A Displaced fracture of base of fifth metacarpal bone. left hand, initial encounter for closed fracture: Secondary | ICD-10-CM | POA: Diagnosis not present

## 2016-11-07 DIAGNOSIS — S6292XA Unspecified fracture of left wrist and hand, initial encounter for closed fracture: Secondary | ICD-10-CM | POA: Diagnosis present

## 2016-11-07 HISTORY — DX: Depression, unspecified: F32.A

## 2016-11-07 HISTORY — DX: Major depressive disorder, single episode, unspecified: F32.9

## 2016-11-07 HISTORY — DX: Anxiety disorder, unspecified: F41.9

## 2016-11-07 HISTORY — PX: OPEN REDUCTION INTERNAL FIXATION (ORIF) METACARPAL: SHX6234

## 2016-11-07 SURGERY — OPEN REDUCTION INTERNAL FIXATION (ORIF) METACARPAL
Anesthesia: Regional | Site: Hand | Laterality: Left

## 2016-11-07 MED ORDER — FENTANYL CITRATE (PF) 100 MCG/2ML IJ SOLN
INTRAMUSCULAR | Status: AC
  Filled 2016-11-07: qty 2

## 2016-11-07 MED ORDER — BUPIVACAINE HCL (PF) 0.25 % IJ SOLN
INTRAMUSCULAR | Status: AC
  Filled 2016-11-07: qty 30

## 2016-11-07 MED ORDER — 0.9 % SODIUM CHLORIDE (POUR BTL) OPTIME
TOPICAL | Status: DC | PRN
  Administered 2016-11-07 (×2): 1000 mL

## 2016-11-07 MED ORDER — PROPOFOL 10 MG/ML IV BOLUS
INTRAVENOUS | Status: AC
  Filled 2016-11-07: qty 20

## 2016-11-07 MED ORDER — MIDAZOLAM HCL 2 MG/2ML IJ SOLN
INTRAMUSCULAR | Status: AC
  Filled 2016-11-07: qty 2

## 2016-11-07 MED ORDER — PROPOFOL 10 MG/ML IV BOLUS
INTRAVENOUS | Status: DC | PRN
  Administered 2016-11-07: 200 mg via INTRAVENOUS
  Administered 2016-11-07: 100 mg via INTRAVENOUS

## 2016-11-07 MED ORDER — OXYCODONE HCL 5 MG/5ML PO SOLN: 5.0000 mg | Freq: Once | ORAL | Status: DC | PRN

## 2016-11-07 MED ORDER — CEFAZOLIN SODIUM-DEXTROSE 2-4 GM/100ML-% IV SOLN
2.0000 g | INTRAVENOUS | Status: AC
  Administered 2016-11-07: 2 g via INTRAVENOUS

## 2016-11-07 MED ORDER — BUPIVACAINE HCL (PF) 0.5 % IJ SOLN
INTRAMUSCULAR | Status: DC | PRN
Start: 2016-11-07 — End: 2016-11-07
  Administered 2016-11-07: 30 mL via PERINEURAL

## 2016-11-07 MED ORDER — SULFAMETHOXAZOLE-TRIMETHOPRIM 800-160 MG PO TABS: 1.0000 | ORAL_TABLET | Freq: Two times a day (BID) | ORAL | 0 refills | Status: DC

## 2016-11-07 MED ORDER — ONDANSETRON HCL 4 MG/2ML IJ SOLN: 4.0000 mg | Freq: Four times a day (QID) | INTRAMUSCULAR | Status: DC | PRN

## 2016-11-07 MED ORDER — OXYCODONE HCL 5 MG PO TABS: 5.0000 mg | ORAL_TABLET | Freq: Once | ORAL | Status: DC | PRN

## 2016-11-07 MED ORDER — LIDOCAINE HCL (CARDIAC) 20 MG/ML IV SOLN
INTRAVENOUS | Status: DC | PRN
  Administered 2016-11-07: 60 mg via INTRAVENOUS

## 2016-11-07 MED ORDER — FENTANYL CITRATE (PF) 100 MCG/2ML IJ SOLN
INTRAMUSCULAR | Status: DC | PRN
  Administered 2016-11-07 (×2): 25 ug via INTRAVENOUS
  Administered 2016-11-07: 50 ug via INTRAVENOUS
  Administered 2016-11-07 (×2): 25 ug via INTRAVENOUS
  Administered 2016-11-07: 50 ug via INTRAVENOUS

## 2016-11-07 MED ORDER — ONDANSETRON HCL 4 MG/2ML IJ SOLN
INTRAMUSCULAR | Status: DC | PRN
  Administered 2016-11-07: 4 mg via INTRAVENOUS

## 2016-11-07 MED ORDER — LACTATED RINGERS IV SOLN
INTRAVENOUS | Status: DC
  Administered 2016-11-07 (×3): via INTRAVENOUS

## 2016-11-07 MED ORDER — CHLORHEXIDINE GLUCONATE 4 % EX LIQD: 60.0000 mL | Freq: Once | CUTANEOUS | Status: DC

## 2016-11-07 MED ORDER — FENTANYL CITRATE (PF) 100 MCG/2ML IJ SOLN
25.0000 ug | INTRAMUSCULAR | Status: DC | PRN
  Administered 2016-11-07: 50 ug via INTRAVENOUS

## 2016-11-07 MED ORDER — CEFAZOLIN SODIUM-DEXTROSE 2-4 GM/100ML-% IV SOLN
INTRAVENOUS | Status: AC
  Filled 2016-11-07: qty 100

## 2016-11-07 MED ORDER — MIDAZOLAM HCL 5 MG/5ML IJ SOLN
INTRAMUSCULAR | Status: DC | PRN
  Administered 2016-11-07 (×2): 1 mg via INTRAVENOUS

## 2016-11-07 SURGICAL SUPPLY — 74 items
ANCH SUT 3-0 MN 1 LD NDL (Anchor) ×1 IMPLANT
ANCHOR JUGGERKNOT 1.0 3-0 NLD (Anchor) ×2 IMPLANT
BANDAGE ACE 3X5.8 VEL STRL LF (GAUZE/BANDAGES/DRESSINGS) ×2 IMPLANT
BANDAGE ACE 4X5 VEL STRL LF (GAUZE/BANDAGES/DRESSINGS) ×3 IMPLANT
BANDAGE ELASTIC 3 VELCRO ST LF (GAUZE/BANDAGES/DRESSINGS) ×1 IMPLANT
BIT DRILL 1.1 (BIT) ×2
BIT DRILL 1.1 MINI QC NONSTRL (BIT) ×2 IMPLANT
BIT DRILL 1.1MM (BIT) ×1
BIT DRILL 60X20X1.1XQC TMX (BIT) IMPLANT
BIT DRL 60X20X1.1XQC TMX (BIT) ×1
BLADE SURG ROTATE 9660 (MISCELLANEOUS) IMPLANT
BNDG CMPR 9X4 STRL LF SNTH (GAUZE/BANDAGES/DRESSINGS) ×1
BNDG ESMARK 4X9 LF (GAUZE/BANDAGES/DRESSINGS) ×3 IMPLANT
BNDG GAUZE ELAST 4 BULKY (GAUZE/BANDAGES/DRESSINGS) ×5 IMPLANT
CORDS BIPOLAR (ELECTRODE) ×3 IMPLANT
COVER SURGICAL LIGHT HANDLE (MISCELLANEOUS) ×3 IMPLANT
CUFF TOURNIQUET SINGLE 18IN (TOURNIQUET CUFF) ×3 IMPLANT
CUFF TOURNIQUET SINGLE 24IN (TOURNIQUET CUFF) IMPLANT
DRAIN TLS ROUND 10FR (DRAIN) IMPLANT
DRAPE INCISE IOBAN 66X45 STRL (DRAPES) ×2 IMPLANT
DRAPE OEC MINIVIEW 54X84 (DRAPES) ×2 IMPLANT
DRAPE SURG 17X23 STRL (DRAPES) ×3 IMPLANT
DRSG ADAPTIC 3X8 NADH LF (GAUZE/BANDAGES/DRESSINGS) ×2 IMPLANT
GAUZE SPONGE 4X4 12PLY STRL (GAUZE/BANDAGES/DRESSINGS) ×3 IMPLANT
GAUZE SPONGE 4X4 16PLY XRAY LF (GAUZE/BANDAGES/DRESSINGS) ×2 IMPLANT
GAUZE XEROFORM 1X8 LF (GAUZE/BANDAGES/DRESSINGS) ×1 IMPLANT
GAUZE XEROFORM 5X9 LF (GAUZE/BANDAGES/DRESSINGS) ×2 IMPLANT
GLOVE BIOGEL M 8.0 STRL (GLOVE) ×1 IMPLANT
GLOVE SS BIOGEL STRL SZ 8 (GLOVE) ×1 IMPLANT
GLOVE SUPERSENSE BIOGEL SZ 8 (GLOVE) ×2
GOWN STRL REUS W/ TWL LRG LVL3 (GOWN DISPOSABLE) ×3 IMPLANT
GOWN STRL REUS W/ TWL XL LVL3 (GOWN DISPOSABLE) ×3 IMPLANT
GOWN STRL REUS W/TWL LRG LVL3 (GOWN DISPOSABLE)
GOWN STRL REUS W/TWL XL LVL3 (GOWN DISPOSABLE) ×9
K-WIRE 6 .028 (WIRE) ×3
KIT BASIN OR (CUSTOM PROCEDURE TRAY) ×3 IMPLANT
KIT ROOM TURNOVER OR (KITS) ×3 IMPLANT
KWIRE 6 .028 (WIRE) IMPLANT
MANIFOLD NEPTUNE II (INSTRUMENTS) ×3 IMPLANT
NEEDLE 22X1 1/2 (OR ONLY) (NEEDLE) ×2 IMPLANT
NS IRRIG 1000ML POUR BTL (IV SOLUTION) ×3 IMPLANT
PACK ORTHO EXTREMITY (CUSTOM PROCEDURE TRAY) ×3 IMPLANT
PAD ARMBOARD 7.5X6 YLW CONV (MISCELLANEOUS) ×6 IMPLANT
PAD CAST 3X4 CTTN HI CHSV (CAST SUPPLIES) ×1 IMPLANT
PAD CAST 4YDX4 CTTN HI CHSV (CAST SUPPLIES) ×1 IMPLANT
PADDING CAST COTTON 3X4 STRL (CAST SUPPLIES) ×3
PADDING CAST COTTON 4X4 STRL (CAST SUPPLIES) ×3
PLATE TYL 1.5 (Plate) ×2 IMPLANT
PUTTY DBM STAGRAFT PLUS 2CC (Putty) ×2 IMPLANT
SCREW 1.5X14 LOCKING 131220414 (Screw) ×2 IMPLANT
SCREW 1.5X18MM (Screw) ×3 IMPLANT
SCREW 1.5X8 (Screw) ×2 IMPLANT
SCREW BN 18X1.5XST NONLOCK (Screw) IMPLANT
SCREW LOCK 1.5X13 131220413 (Screw) ×2 IMPLANT
SCREW LOCKING 1.5X16 (Screw) ×2 IMPLANT
SCREW NON LOCK 1.5X12 13122051 (Screw) ×4 IMPLANT
SCREW NONIOC 1.5 10M (Screw) ×2 IMPLANT
SPLINT FIBERGLASS 4X30 (CAST SUPPLIES) ×2 IMPLANT
SPONGE LAP 4X18 X RAY DECT (DISPOSABLE) IMPLANT
SUT FIBER WIRE 4.0 (SUTURE) ×2 IMPLANT
SUT MNCRL AB 4-0 PS2 18 (SUTURE) ×1 IMPLANT
SUT PROLENE 3 0 PS 2 (SUTURE) ×2 IMPLANT
SUT PROLENE 4 0 P 3 18 (SUTURE) ×6 IMPLANT
SUT PROLENE 4 0 PS 2 18 (SUTURE) ×4 IMPLANT
SUT VIC AB 3-0 FS2 27 (SUTURE) ×2 IMPLANT
SYR CONTROL 10ML LL (SYRINGE) ×2 IMPLANT
SYSTEM CHEST DRAIN TLS 7FR (DRAIN) IMPLANT
TOWEL OR 17X24 6PK STRL BLUE (TOWEL DISPOSABLE) ×3 IMPLANT
TOWEL OR 17X26 10 PK STRL BLUE (TOWEL DISPOSABLE) ×3 IMPLANT
TUBE CONNECTING 12'X1/4 (SUCTIONS) ×1
TUBE CONNECTING 12X1/4 (SUCTIONS) ×2 IMPLANT
TUBE EVACUATION TLS (MISCELLANEOUS) ×3 IMPLANT
WATER STERILE IRR 1000ML POUR (IV SOLUTION) ×3 IMPLANT
WIRE K .035MM 164206035 (MISCELLANEOUS) ×6 IMPLANT

## 2016-11-07 NOTE — Anesthesia Procedure Notes (Signed)
Procedure Name: LMA Insertion Date/Time: 11/07/2016 1:08 PM Performed by: Dairl PonderJIANG, Caron Tardif Pre-anesthesia Checklist: Patient identified, Emergency Drugs available, Suction available, Patient being monitored and Timeout performed Patient Re-evaluated:Patient Re-evaluated prior to inductionOxygen Delivery Method: Circle system utilized Preoxygenation: Pre-oxygenation with 100% oxygen Intubation Type: IV induction Ventilation: Mask ventilation without difficulty LMA: LMA inserted LMA Size: 5.0 Number of attempts: 1 Tube secured with: Tape Dental Injury: Teeth and Oropharynx as per pre-operative assessment
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
## 2016-11-07 NOTE — Anesthesia Procedure Notes (Signed)
Anesthesia Regional Block:  Supraclavicular block  Pre-Anesthetic Checklist: ,, timeout performed, Correct Patient, Correct Site, Correct Laterality, Correct Procedure, Correct Position, site marked, Risks and benefits discussed,  Surgical consent,  Pre-op evaluation,  At surgeon's request and post-op pain management  Laterality: Left  Prep: chloraprep       Needles:  Injection technique: Single-shot  Needle Type: Echogenic Stimulator Needle     Needle Length: 5cm 5 cm Needle Gauge: 22 and 22 G    Additional Needles:  Procedures: ultrasound guided (picture in chart) and nerve stimulator Supraclavicular block  Nerve Stimulator or Paresthesia:  Response: biceps flexion, 0.45 mA,   Additional Responses:   Narrative:  Start time: 11/07/2016 11:48 AM End time: 11/07/2016 11:56 AM Injection made incrementally with aspirations every 5 mL.  Performed by: Personally  Anesthesiologist: Binnie Droessler  Additional Notes: Functioning IV was confirmed and monitors were applied.  A 50mm 22ga Arrow echogenic stimulator needle was used. Sterile prep and drape,hand hygiene and sterile gloves were used.  Negative aspiration and negative test dose prior to incremental administration of local anesthetic. The patient tolerated the procedure well.  Ultrasound guidance: relevent anatomy identified, needle position confirmed, local anesthetic spread visualized around nerve(s), vascular puncture avoided.  Image printed for medical record.

## 2016-11-07 NOTE — Op Note (Signed)
see [id number]  Status post #1 open reduction internal fixation comminuted left fifth metacarpal fracture at the [facility] intra-articular in nature with Biomet plate and screw and stay graft #2 open reduction internal fixation ring finger metacarpal fracture with IM rod technique #3 open reduction internal fixation intra-articular thumb MCP fracture with K wire fixation #4 gamekeepers/ulnar collateral ligament reconstruction left thumb #5 radiographic examination of the hand greater than 6 views #6 dorsal sensory ulnar nerve neurolysis left hand #7 closed treatment right wrist pisiform fracture Surgeon Dominica SeverinWilliam Shalinda Burkholder

## 2016-11-07 NOTE — Discharge Instructions (Signed)

## 2016-11-07 NOTE — Transfer of Care (Signed)
Immediate Anesthesia Transfer of Care Note  Patient: Harold Mccarthy  Procedure(s) Performed: Procedure(s): Left thumb ORIF reconstruction, Left 4th and 5th metacarpal fracture ORIF as necessary OPEN REDUCTION INTERNAL FIXATION (ORIF) METACARPAL (Left)  Patient Location: PACU  Anesthesia Type:General and Regional  Level of Consciousness: awake, alert  and oriented  Airway & Oxygen Therapy: Patient Spontanous Breathing and Patient connected to nasal cannula oxygen  Post-op Assessment: Report given to RN and Post -op Vital signs reviewed and stable  Post vital signs: Reviewed and stable  Last Vitals:  Vitals:   11/07/16 1010 11/07/16 1545  BP: 134/75 (!) (P) 126/94  Pulse:  (!) 112  Resp: 18 (!) 21  Temp: 37.1 C 36.7 C    Last Pain:  Vitals:   11/07/16 1010  TempSrc: Oral  PainSc:       Patients Stated Pain Goal: 5 (11/07/16 1005)  Complications: No apparent anesthesia complications

## 2016-11-07 NOTE — Anesthesia Preprocedure Evaluation (Signed)
Anesthesia Evaluation  Patient identified by MRN, date of birth, ID band Patient awake    Reviewed: Allergy & Precautions, H&P , NPO status , Patient's Chart, lab work & pertinent test results  Airway Mallampati: II   Neck ROM: full    Dental   Pulmonary Current Smoker,    breath sounds clear to auscultation       Cardiovascular negative cardio ROS   Rhythm:regular Rate:Normal     Neuro/Psych PSYCHIATRIC DISORDERS Anxiety Depression    GI/Hepatic   Endo/Other    Renal/GU      Musculoskeletal   Abdominal   Peds  Hematology   Anesthesia Other Findings   Reproductive/Obstetrics                             Anesthesia Physical Anesthesia Plan  ASA: II  Anesthesia Plan: General and Regional   Post-op Pain Management:  Regional for Post-op pain   Induction: Intravenous  Airway Management Planned: LMA  Additional Equipment:   Intra-op Plan:   Post-operative Plan:   Informed Consent: I have reviewed the patients History and Physical, chart, labs and discussed the procedure including the risks, benefits and alternatives for the proposed anesthesia with the patient or authorized representative who has indicated his/her understanding and acceptance.     Plan Discussed with: CRNA, Anesthesiologist and Surgeon  Anesthesia Plan Comments:         Anesthesia Quick Evaluation

## 2016-11-07 NOTE — Anesthesia Postprocedure Evaluation (Signed)
Anesthesia Post Note  Patient: Leafy RoBradley E Dilorenzo  Procedure(s) Performed: Procedure(s) (LRB): Left thumb ORIF reconstruction, Left 4th and 5th metacarpal fracture ORIF as necessary OPEN REDUCTION INTERNAL FIXATION (ORIF) METACARPAL (Left)  Patient location during evaluation: PACU Anesthesia Type: General Level of consciousness: awake and alert and patient cooperative Pain management: pain level controlled Vital Signs Assessment: post-procedure vital signs reviewed and stable Respiratory status: spontaneous breathing and respiratory function stable Cardiovascular status: stable Anesthetic complications: no    Last Vitals:  Vitals:   11/07/16 1618 11/07/16 1645  BP: 127/85 (!) 124/42  Pulse: 99   Resp: 15   Temp:  37.9 C    Last Pain:  Vitals:   11/07/16 1645  TempSrc:   PainSc: 2                  Elise Gladden S

## 2016-11-09 ENCOUNTER — Encounter (HOSPITAL_COMMUNITY): Payer: Self-pay | Admitting: Orthopedic Surgery

## 2016-11-09 NOTE — Op Note (Signed)
NAMMordecai Maes:  [place], [place]               ACCOUNT NO.:  [id number]  MEDICAL RECORD NO.:  [id number]  LOCATION:                                 FACILITY:  PHYSICIAN:  Dionne AnoWilliam M. Bayler Gehrig, M.D.DATE OF BIRTH:  22-Jun-1981  DATE OF PROCEDURE:11/07/2016 DATE OF DISCHARGE:11/07/2016                              OPERATIVE REPORT   PREOPERATIVE DIAGNOSES: 1. Fourth metacarpal shaft fracture, left hand. 2. Fifth carpometacarpal intra-articular fracture about fifth     metacarpal region, left hand. 3. Bony fracture proximal phalanx/metacarpophalangeal joint, left     thumb. 4. Ulnar collateral ligament disruption, left thumb. 5. Right comminuted pisiform fracture.  SURGICAL PROCEDURE PERFORMED: 1. Open reduction and internal fixation comminuted left metacarpal     fracture at the carpometacarpal joint with intra-articular     extension.  Fixation was obtained with Biomet plate and screw     construct 1.5 mm in nature and a StaGraft bone substitute. 2. Open reduction and internal fixation with IM rod technique, left     ring finger. 3. Open reduction and internal fixation, intra-articular thumb     metacarpophalangeal joint fracture with K-wire fixation. 4. Ulnar collateral ligament repair reconstruction, left thumb. 5. Radiographic examination of the hand greater than 6 views. 6. Dorsal sensory ulnar nerve neurolysis, left hand extensive in     nature. 7. Closed treatment, right wrist comminuted complex pisiform fracture.  SURGEON:  Dionne AnoWilliam M. Amanda PeaGramig, M.D.  ASSISTANT:  None.  COMPLICATIONS:  None.  ANESTHESIA:  General.  TOURNIQUET TIME:  Less than 2 hours.  INDICATIONS:  This patient is a 35 year old male who presents with the above-mentioned diagnoses.  He has a host of injuries including the above mentioned right and left upper extremity injuries.  He also has hematoma to the lower extremities and bilateral ankle sprains with history of prior trauma.  I have discussed with him the  risks and benefits of surgery, do's and don'ts, etc., and all questions have been encouraged and answered.  OPERATIVE PROCEDURE IN DETAIL:  The patient was seen by myself and Anesthesia, underwent smooth induction of general anesthesia. Preoperatively, a block was placed.  He was counseled extensively.  Arm was marked and the arm was then prepped with Hibiclens pre-scrub followed by Betadine scrub and paint.  Once this was complete, outline marks were made.  Arm was elevated.  Time-out was observed and a curvilinear modified Brunner incision was made overlying the dorsal ulnar aspect of the left hand.  Dorsal sensory ulnar nerve neurolysis extensive in nature was accomplished.  There was a large amount of fibrinous tissue and bloody tissue in this region, thus a distinct and separate neurolysis was performed.  At this time, I then performed very careful and cautious approach to the ring finger metacarpal fracture.  A pilot hole was made at the base and very carefully a 0.062 blunt-tipped and pre-bent ended wire was then advanced across the fracture site.  I pre-bent again and then seated distally.  This provided excellent intramedullary fixation of the ring finger metacarpal fracture.  AP, lateral, and oblique x-rays looked perfect.  Following this, attention was turned towards the small finger metacarpal base/CMC intra-articular fracture.  This was exposed  very carefully with scissor and knife following this and protecting the dorsal sensory branch of the ulnar nerve.  I then performed very careful and cautious approach to the ORIF.  Provisional fixation with K-wire was accomplished.  He had a large split in the [facility] region.  I was able to piecemeal the T condylar fracture together, hold fixation, apply StaGraft bone graft, and then apply Biomet 1.5 Ty plate.  The patient tolerated this well.  The Ty plate was sculpted prior to placement and I was able to achieve excellent purchase.  I  was able to engage the 2 distal fragments at the base of the [facility] joint, which provided excellent fixation.  I was quite pleased with this.  Following this, we then very carefully and cautiously packed additional bone graft and performed and oversewed the periosteum.  AP, lateral, and oblique x-rays showed excellent position of the screws and I was quite pleased.  This was a very complex ORIF, we will be very careful in the postop recovery to make sure we do not stress this.  Finger splay looked excellent.  There were no complicating features.  Area was irrigated and closed with Prolene.  Following this, attention was turned towards the thumb.  Curvilinear ulnar incision was made.  Dissection was carried down.  Ulnar collateral ligament tear and an osteochondral fracture of the MCP joint were noted. I pre-placed K-wires for the osteochondral fracture ORIF and following this, irrigated the joint, performed arthrotomy, synovectomy, and then performed placement of a JuggerKnot against the area distal to the fracture.  The JuggerKnot was seated nicely, there were no complicating features.  Once this was complete, I then very carefully and cautiously performed placement of the osteochondral fracture into position and pinned this x2 pins.  These pins were then pre-clipped and bent. Excellent fixation was noted on AP, lateral, and oblique x-rays.  I should note that during the approach, the dorsal sensory branch of the radial nerve was very carefully protected.  The patient tolerated this well.  There were no complicating features.  Following this, the patient had the ulnar collateral ligament repaired with the JuggerKnot fixation as an anchorage point.  Weave of modified Krackow stitch was placed through this area under 4.5 loupe magnification and the ligament repaired well.  Following this, I then closed the adductor aponeurosis and made sure there was no Stener component to the injury.   Superficial radial nerve looked excellent.  I irrigated with an L of saline.  Closed the wound with 0-Prolene at the end of the case with hemostasis secured and refill noted to be excellent.  The patient tolerated this well. There were no complicating features.  Following this, I discussed with the patient and his family elevation, range of motion, and our postop plans.  The pisiform fracture on the right wrist is going to be treated in a nonsurgical fashion at this juncture, however, significant risk of traumatic arthrosis are there and certainly this may be something that requires more aggressive measures in the future including pisiform excision.  I have discussed with the patient these issues at length, do's and don'ts, etc., and all questions have been encouraged and answered.  He will be discharged home on appropriate antibiotics, pain medicines, and other measures.  Should problems arise, I will be immediately available.     Dionne AnoWilliam M. Amanda PeaGramig, M.D.   ______________________________ Dionne AnoWilliam M. Amanda PeaGramig, M.D.    [facility]  D:  11/07/2016  T:  11/08/2016  Job:  [id number]

## 2017-01-03 ENCOUNTER — Emergency Department (HOSPITAL_COMMUNITY)
Admission: EM | Admit: 2017-01-03 | Discharge: 2017-01-03 | Disposition: A | Discharge status: Home / Self Care | Payer: BLUE CROSS/BLUE SHIELD | Attending: Emergency Medicine | Admitting: Emergency Medicine

## 2017-01-03 ENCOUNTER — Encounter (HOSPITAL_COMMUNITY): Payer: Self-pay | Admitting: Emergency Medicine

## 2017-01-03 DIAGNOSIS — F17200 Nicotine dependence, unspecified, uncomplicated: Secondary | ICD-10-CM | POA: Diagnosis not present

## 2017-01-03 DIAGNOSIS — H66012 Acute suppurative otitis media with spontaneous rupture of ear drum, left ear: Secondary | ICD-10-CM | POA: Diagnosis not present

## 2017-01-03 DIAGNOSIS — H9202 Otalgia, left ear: Secondary | ICD-10-CM | POA: Diagnosis present

## 2017-01-03 MED ORDER — CIPROFLOXACIN-DEXAMETHASONE 0.3-0.1 % OT SUSP
4.0000 [drp] | Freq: Once | OTIC | Status: AC
  Administered 2017-01-03: 4 [drp] via OTIC
  Filled 2017-01-03: qty 7.5

## 2017-01-03 MED ORDER — AMOXICILLIN 875 MG PO TABS: 875.0000 mg | ORAL_TABLET | Freq: Two times a day (BID) | ORAL | 0 refills | Status: DC

## 2017-01-03 MED ORDER — AMOXICILLIN 500 MG PO CAPS
1000.0000 mg | ORAL_CAPSULE | Freq: Once | ORAL | Status: AC
  Administered 2017-01-03: 1000 mg via ORAL
  Filled 2017-01-03: qty 2

## 2017-01-03 NOTE — ED Triage Notes (Signed)
Pt reports ear pain that began yesterday, reports using a qtip and then using ear drops and placing cotton in his ear, pt states pain got worse, was seen at urgent care and was told he needed to be seen here.

## 2017-01-03 NOTE — ED Provider Notes (Signed)
[facility]-EMERGENCY DEPT Provider Note   CSN: [id number] Arrival date & time: 01/03/17  1221  History   Chief Complaint Chief Complaint  Patient presents with  . Otalgia   HPI Harold Mccarthy is a 36 y.o. male.  The history is provided by the patient and medical records. No language interpreter was used.  Illness  This is a new problem. The current episode started 2 days ago. The problem occurs constantly. The problem has been gradually worsening. Pertinent negatives include no chest pain, no abdominal pain, no headaches and no shortness of breath. Nothing aggravates the symptoms. Nothing relieves the symptoms.    Past Medical History:  Diagnosis Date  . Allergy   . Anxiety   . Chronic back pain   . Depression    There are no active problems to display for this patient.  Past Surgical History:  Procedure Laterality Date  . NO PAST SURGERIES    . OPEN REDUCTION INTERNAL FIXATION (ORIF) METACARPAL Left 11/07/2016   Procedure: Left thumb ORIF reconstruction, Left 4th and 5th metacarpal fracture ORIF as necessary OPEN REDUCTION INTERNAL FIXATION (ORIF) METACARPAL;  Surgeon: Dominica Severin, MD;  Location: [facility] OR;  Service: Orthopedics;  Laterality: Left;    Home Medications    Prior to Admission medications   Medication Sig Start Date End Date Taking? Authorizing Provider  amoxicillin (AMOXIL) 875 MG tablet Take 1 tablet (875 mg total) by mouth 2 (two) times daily. 01/03/17   Angelina Ok, MD  clonazePAM (KLONOPIN) 0.5 MG tablet Take 1 tablet (0.5 mg total) by mouth 2 (two) times daily as needed for anxiety. Patient not taking: Reported on 11/05/2016 09/11/15   Elvina Sidle, MD  ibuprofen (ADVIL,MOTRIN) 200 MG tablet Take 200 mg by mouth every 6 (six) hours as needed for mild pain.    Historical Provider, MD  oxyCODONE-acetaminophen (PERCOCET) 5-325 MG per tablet Take 1 tablet by mouth every 4 (four) hours as needed for moderate pain. Patient not taking: Reported on 11/05/2016  09/02/15   Dione Booze, MD  oxyCODONE-acetaminophen (PERCOCET/ROXICET) 5-325 MG per tablet Take 1 tablet by mouth every 4 (four) hours as needed. Patient not taking: Reported on 11/05/2016 09/02/15   Dione Booze, MD  oxyCODONE-acetaminophen (PERCOCET/ROXICET) 5-325 MG tablet Take 1-2 tablets by mouth every 6 (six) hours as needed for severe pain. 11/05/16   Cathren Laine, MD  sertraline (ZOLOFT) 50 MG tablet Take 1 tablet (50 mg total) by mouth daily. Patient not taking: Reported on 11/05/2016 09/11/15   Elvina Sidle, MD  sulfamethoxazole-trimethoprim (BACTRIM DS,SEPTRA DS) 800-160 MG tablet Take 1 tablet by mouth 2 (two) times daily. 11/07/16   Dominica Severin, MD   Family History Family History  Problem Relation Age of Onset  . Stroke Mother    Social History Social History  Substance Use Topics  . Smoking status: Current Every Day Smoker    Packs/day: 1.50  . Smokeless tobacco: Never Used  . Alcohol use 0.0 oz/week     Comment: occasional    Allergies   Patient has no known allergies.  Review of Systems Review of Systems  Constitutional: Positive for chills and fever (subjective).  HENT: Positive for ear discharge and ear pain.   Respiratory: Negative for shortness of breath.   Cardiovascular: Negative for chest pain.  Gastrointestinal: Negative for abdominal pain.  Neurological: Negative for headaches.  All other systems reviewed and are negative.   Physical Exam Updated Vital Signs BP 141/82 (BP Location: Right Arm)   Pulse 109  Temp 98.9 F (37.2 C)   Resp 20   SpO2 100%   Physical Exam  Constitutional: He is oriented to person, place, and time. He appears well-developed and well-nourished. No distress.  HENT:  Head: Normocephalic and atraumatic.  R TM occluded by purulent serous material, no obvious injury to the ear canal, FROM of pinna without pain  Eyes: EOM are normal. Pupils are equal, round, and reactive to light.  Neck: Normal range of motion. Neck  supple.  Cardiovascular: Normal rate, regular rhythm and normal heart sounds.   Pulmonary/Chest: Effort normal and breath sounds normal.  Abdominal: Soft. Bowel sounds are normal. He exhibits no distension. There is no tenderness.  Musculoskeletal: Normal range of motion.  Neurological: He is alert and oriented to person, place, and time.  Skin: Skin is warm and dry. Capillary refill takes less than 2 seconds. He is not diaphoretic.  Nursing note and vitals reviewed.   ED Treatments / Results  Labs (all labs ordered are listed, but only abnormal results are displayed) Labs Reviewed - No data to display  EKG  EKG Interpretation None      Radiology No results found.  Procedures Procedures (including critical care time)  Medications Ordered in ED Medications  ciprofloxacin-dexamethasone (CIPRODEX) 0.3-0.1 % otic suspension 4 drop (4 drops Left Ear Given 01/03/17 1621)  amoxicillin (AMOXIL) capsule 1,000 mg (1,000 mg Oral Given 01/03/17 1619)     Initial Impression / Assessment and Plan / ED Course  I have reviewed the triage vital signs and the nursing notes.  36 y.o. male with above stated PMHx, HPI, and physical. Symptom onset 2 days ago. Patient cleans his ears with Q-tips on a regular basis. Patient was seen at urgent care yesterday with pain got worse and he be experiencing subjective fever and chills. Medical personnel attempted remove cotton swab following which patient jerked and had worsening of his pain. Since then he's had small amount of bloody discharge.  Examination history consistent with acute suppurative otitis media with probable perforation. Patient placed on Ciprodex drops here and sent home with bottle and given prescription for amoxicillin. Patient will follow-up with his PCP for repeat evaluation to confirm perforation and provide further guidance.  Pt discharged home in stable condition. Strict ED return precautions dicussed. Pt understands and agrees with  the plan and has no further questions or concerns.   Pt care discussed with and followed by my attending, Dr. Rolland PorterMark James  Angelina Okyan Duvan Mousel, MD Pager [phone]  Final Clinical Impressions(s) / ED Diagnoses   Final diagnoses:  Acute suppurative otitis media of left ear with spontaneous rupture of tympanic membrane, recurrence not specified   New Prescriptions New Prescriptions   AMOXICILLIN (AMOXIL) 875 MG TABLET    Take 1 tablet (875 mg total) by mouth 2 (two) times daily.     Angelina Okyan Emmah Bratcher, MD 01/03/17 1623    Rolland PorterMark James, MD 01/14/17 1537

## 2017-01-03 NOTE — ED Provider Notes (Signed)
Pt seen and evaluated. D/W Dr. Kathrynn RunningFranakiak.  Pt with purulent DC from  Lt ear canal c/w OM ant TM perf. Agree with Tx plan.   Rolland PorterMark Bryttani Blew, MD 01/03/17 1620
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
## 2022-11-25 ENCOUNTER — Telehealth: Payer: Self-pay | Admitting: Family

## 2022-11-25 NOTE — Telephone Encounter (Signed)
Pt wants to know if Harold Mccarthy will see him as a new patient since his daughters have her as their PCP? Pt is trying to be seen ASAP because he is having issues with depression/bad nerves.  Please advise and call patient.

## 2022-11-26 NOTE — Telephone Encounter (Signed)
Appt made

## 2022-11-26 NOTE — Telephone Encounter (Signed)
Ok to schedule patient.   Jannifer Rodney, FNP

## 2022-12-11 ENCOUNTER — Encounter: Payer: Self-pay | Admitting: Family

## 2022-12-11 ENCOUNTER — Ambulatory Visit (INDEPENDENT_AMBULATORY_CARE_PROVIDER_SITE_OTHER): Payer: Self-pay | Admitting: Family

## 2022-12-11 VITALS — BP 135/76 | HR 82 | Temp 97.4°F | Ht 72.0 in | Wt 220.0 lb

## 2022-12-11 DIAGNOSIS — F320 Major depressive disorder, single episode, mild: Secondary | ICD-10-CM

## 2022-12-11 DIAGNOSIS — F411 Generalized anxiety disorder: Secondary | ICD-10-CM

## 2022-12-11 MED ORDER — ESCITALOPRAM OXALATE 10 MG PO TABS: 10.0000 mg | ORAL_TABLET | Freq: Every day | ORAL | 3 refills | Status: AC

## 2022-12-11 MED ORDER — BUSPIRONE HCL 5 MG PO TABS: 5.0000 mg | ORAL_TABLET | Freq: Three times a day (TID) | ORAL | 1 refills | Status: AC | PRN

## 2022-12-11 NOTE — Patient Instructions (Signed)

## 2022-12-11 NOTE — Progress Notes (Signed)
Subjective:    Patient ID: Harold Mccarthy, male    DOB: 1981/01/21, 42 y.o.   MRN: [id number]  Chief Complaint  Patient presents with   New Patient (Initial Visit)   Anxiety   PT presents to the office today today to establish care and discuss anxiety. He reports he had seen behavorial health provider in the past and placed on zoloft and klonopin that helped his anxiety. He has not been on these for years. He is a single dad since 2020, and his children's mother just got out of prison and seeking to get custody of children. He reports this is a great deal of stress.  Anxiety Presents for follow-up visit. Symptoms include depressed mood, excessive worry, irritability, nervous/anxious behavior and restlessness. Symptoms occur most days. The severity of symptoms is moderate.    Depression        This is a new problem.  The current episode started more than 1 year ago.   The problem occurs intermittently.  Associated symptoms include helplessness, hopelessness, restlessness and decreased interest.  Associated symptoms include not sad.     The symptoms are aggravated by family issues.  Past treatments include nothing.  Past medical history includes anxiety.       Review of Systems  Constitutional:  Positive for irritability.  Psychiatric/Behavioral:  Positive for depression. The patient is nervous/anxious.   All other systems reviewed and are negative.  Family History  Problem Relation Age of Onset   Stroke Mother    Social History   Socioeconomic History   Marital status: Single    Spouse name: Not on file   Number of children: Not on file   Years of education: Not on file   Highest education level: Not on file  Occupational History   Not on file  Tobacco Use   Smoking status: Every Day    Packs/day: 1.50    Types: Cigarettes   Smokeless tobacco: Never  Substance and Sexual Activity   Alcohol use: Yes    Alcohol/week: 0.0 standard drinks of alcohol    Comment: occasional    Drug use: No   Sexual activity: Yes  Other Topics Concern   Not on file  Social History Narrative   Not on file   Social Determinants of Health   Financial Resource Strain: Not on file  Food Insecurity: Not on file  Transportation Needs: Not on file  Physical Activity: Not on file  Stress: Not on file  Social Connections: Not on file       Objective:   Physical Exam Vitals reviewed.  Constitutional:      General: He is not in acute distress.    Appearance: He is well-developed.  HENT:     Head: Normocephalic.     Right Ear: Tympanic membrane normal.     Left Ear: Tympanic membrane normal.  Eyes:     General:        Right eye: No discharge.        Left eye: No discharge.     Pupils: Pupils are equal, round, and reactive to light.  Neck:     Thyroid: No thyromegaly.  Cardiovascular:     Rate and Rhythm: Normal rate and regular rhythm.     Heart sounds: Normal heart sounds. No murmur heard. Pulmonary:     Effort: Pulmonary effort is normal. No respiratory distress.     Breath sounds: Normal breath sounds. No wheezing.  Abdominal:     General:  Bowel sounds are normal. There is no distension.     Palpations: Abdomen is soft.     Tenderness: There is no abdominal tenderness.  Musculoskeletal:        General: No tenderness. Normal range of motion.     Cervical back: Normal range of motion and neck supple.  Skin:    General: Skin is warm and dry.     Findings: No erythema or rash.  Neurological:     Mental Status: He is alert and oriented to person, place, and time.     Cranial Nerves: No cranial nerve deficit.     Deep Tendon Reflexes: Reflexes are normal and symmetric.  Psychiatric:        Mood and Affect: Mood is anxious.        Behavior: Behavior normal.        Thought Content: Thought content normal.        Judgment: Judgment normal.       BP 135/76   Pulse 82   Temp (!) 97.4 F (36.3 C) (Temporal)   Ht 6' (1.829 m)   Wt 220 lb (99.8 kg)   SpO2  99%   BMI 29.84 kg/m      Assessment & Plan:  TRAVIN MARIK comes in today with chief complaint of New Patient (Initial Visit) and Anxiety   Diagnosis and orders addressed:  1. GAD (generalized anxiety disorder) - escitalopram (LEXAPRO) 10 MG tablet; Take 1 tablet (10 mg total) by mouth daily.  Dispense: 90 tablet; Refill: 3 - busPIRone (BUSPAR) 5 MG tablet; Take 1 tablet (5 mg total) by mouth 3 (three) times daily as needed.  Dispense: 90 tablet; Refill: 1  2. Depression, major, single episode, mild (HCC) - escitalopram (LEXAPRO) 10 MG tablet; Take 1 tablet (10 mg total) by mouth daily.  Dispense: 90 tablet; Refill: 3 - busPIRone (BUSPAR) 5 MG tablet; Take 1 tablet (5 mg total) by mouth 3 (three) times daily as needed.  Dispense: 90 tablet; Refill: 1   Start Lexapro 10 mg and Buspar 5 mg TID prn  Stress management  Health Maintenance reviewed Diet and exercise encouraged  Follow up plan: 6 weeks video   Evelina Dun, FNP

## 2022-12-15 ENCOUNTER — Telehealth: Payer: Self-pay | Admitting: Family

## 2023-01-22 ENCOUNTER — Encounter: Payer: Self-pay | Admitting: Family

## 2023-01-22 ENCOUNTER — Telehealth (INDEPENDENT_AMBULATORY_CARE_PROVIDER_SITE_OTHER): Payer: Self-pay | Admitting: Family

## 2023-01-22 DIAGNOSIS — F320 Major depressive disorder, single episode, mild: Secondary | ICD-10-CM | POA: Insufficient documentation

## 2023-01-22 DIAGNOSIS — F411 Generalized anxiety disorder: Secondary | ICD-10-CM | POA: Insufficient documentation

## 2023-01-22 NOTE — Progress Notes (Signed)
Virtual Visit Consent   Harold Mccarthy, you are scheduled for a virtual visit with a [facility] provider today. Just as with appointments in the office, your consent must be obtained to participate. Your consent will be active for this visit and any virtual visit you may have with one of our providers in the next 365 days. If you have a MyChart account, a copy of this consent can be sent to you electronically.  As this is a virtual visit, video technology does not allow for your provider to perform a traditional examination. This may limit your provider's ability to fully assess your condition. If your provider identifies any concerns that need to be evaluated in person or the need to arrange testing (such as labs, EKG, etc.), we will make arrangements to do so. Although advances in technology are sophisticated, we cannot ensure that it will always work on either your end or our end. If the connection with a video visit is poor, the visit may have to be switched to a telephone visit. With either a video or telephone visit, we are not always able to ensure that we have a secure connection.  By engaging in this virtual visit, you consent to the provision of healthcare and authorize for your insurance to be billed (if applicable) for the services provided during this visit. Depending on your insurance coverage, you may receive a charge related to this service.  I need to obtain your verbal consent now. Are you willing to proceed with your visit today? Harold Mccarthy has provided verbal consent on 01/22/2023 for a virtual visit (video or telephone). Evelina Dun, FNP  Date: 01/22/2023 9:24 AM  Virtual Visit via Video Note   I, Evelina Dun, connected with  Harold Mccarthy  (NO:[id number], 42) on 01/22/23 at  8:25 AM EST by a video-enabled telemedicine application and verified that I am speaking with the correct person using two identifiers.  Location: Patient: Virtual Visit Location Patient:  Other: work Provider: Ecologist: Office/Clinic   I discussed the limitations of evaluation and management by telemedicine and the availability of in person appointments. The patient expressed understanding and agreed to proceed.    History of Present Illness: Harold Mccarthy is a 42 y.o. who identifies as a male who was assigned male at birth, and is being seen today for GAD and depression. He was seen on 12/11/22 and started on Lexapro 10 mg and Buspar 5 mg TID prn. Report he took this for two days, but made him sleepy.   HPI: Anxiety Presents for follow-up visit. Symptoms include excessive worry, nervous/anxious behavior and restlessness. Symptoms occur occasionally. The severity of symptoms is mild.    Depression        This is a chronic problem.  The current episode started more than 1 year ago.   The problem occurs intermittently.  Associated symptoms include helplessness, hopelessness, restlessness and sad.  Past medical history includes anxiety.   Nicotine Dependence Presents for follow-up visit. His urge triggers include company of smokers. The symptoms have been stable.    Problems:  Patient Active Problem List   Diagnosis Date Noted   Depression, major, single episode, mild ([place]) 01/22/2023   GAD (generalized anxiety disorder) 01/22/2023    Allergies: No Known Allergies Medications:  Current Outpatient Medications:    busPIRone (BUSPAR) 5 MG tablet, Take 1 tablet (5 mg total) by mouth 3 (three) times daily as needed., Disp: 90 tablet, Rfl: 1  escitalopram (LEXAPRO) 10 MG tablet, Take 1 tablet (10 mg total) by mouth daily., Disp: 90 tablet, Rfl: 3  Observations/Objective: Patient is well-developed, well-nourished in no acute distress.  Resting comfortably   Head is normocephalic, atraumatic.  No labored breathing.  Speech is clear and coherent with logical content.  Patient is alert and oriented at baseline.    Assessment and Plan: 1. GAD  (generalized anxiety disorder)  2. Depression, major, single episode, mild (HCC)  Discussed pt should start Lexapro 10 mg at night Use Buspar 5 mg TID prn as needed  Stress management  Follow up in 1 month   Follow Up Instructions: I discussed the assessment and treatment plan with the patient. The patient was provided an opportunity to ask questions and all were answered. The patient agreed with the plan and demonstrated an understanding of the instructions.  A copy of instructions were sent to the patient via MyChart unless otherwise noted below.    The patient was advised to call back or seek an in-person evaluation if the symptoms worsen or if the condition fails to improve as anticipated.  Time:  I spent 16 minutes with the patient via telehealth technology discussing the above problems/concerns.    Evelina Dun, FNP

## 2023-01-22 NOTE — Patient Instructions (Signed)
